# Patient Record
Sex: Female | Born: 1975 | Race: White | Hispanic: No | Marital: Married | Smoking: Never smoker
Health system: Southern US, Community
[De-identification: ages and names within clinical notes are randomized; demographics above are authoritative.]

## PROBLEM LIST (undated history)

## (undated) DIAGNOSIS — R079 Chest pain, unspecified: Secondary | ICD-10-CM

## (undated) DIAGNOSIS — E559 Vitamin D deficiency, unspecified: Secondary | ICD-10-CM

## (undated) DIAGNOSIS — F419 Anxiety disorder, unspecified: Secondary | ICD-10-CM

## (undated) DIAGNOSIS — E669 Obesity, unspecified: Secondary | ICD-10-CM

## (undated) HISTORY — DX: Obesity, unspecified: E66.9

## (undated) HISTORY — DX: Chest pain, unspecified: R07.9

## (undated) HISTORY — DX: Anxiety disorder, unspecified: F41.9

## (undated) HISTORY — DX: Vitamin D deficiency, unspecified: E55.9

---

## 2008-09-29 ENCOUNTER — Emergency Department (HOSPITAL_COMMUNITY): Admission: EM | Admit: 2008-09-29 | Discharge: 2008-09-30 | Payer: Self-pay | Admitting: Emergency Medicine

## 2009-05-10 ENCOUNTER — Emergency Department (HOSPITAL_COMMUNITY): Admission: EM | Admit: 2009-05-10 | Discharge: 2009-05-10 | Payer: Self-pay | Admitting: Emergency Medicine

## 2010-06-16 LAB — DIFFERENTIAL
Basophils Relative: 1 % (ref 0–1)
Lymphs Abs: 2 10*3/uL (ref 0.7–4.0)
Monocytes Relative: 7 % (ref 3–12)
Neutro Abs: 6 10*3/uL (ref 1.7–7.7)
Neutrophils Relative %: 67 % (ref 43–77)

## 2010-06-16 LAB — POCT CARDIAC MARKERS: Myoglobin, poc: 38.3 ng/mL (ref 12–200)

## 2010-06-16 LAB — CBC
RBC: 4.82 MIL/uL (ref 3.87–5.11)
WBC: 8.9 10*3/uL (ref 4.0–10.5)

## 2010-06-16 LAB — BASIC METABOLIC PANEL
Calcium: 9.3 mg/dL (ref 8.4–10.5)
Creatinine, Ser: 1.04 mg/dL (ref 0.4–1.2)
GFR calc Af Amer: >60 mL/min (ref >60)
GFR calc non Af Amer: >60 mL/min (ref >60)

## 2010-06-16 LAB — URINALYSIS, ROUTINE W REFLEX MICROSCOPIC
Glucose, UA: NEGATIVE mg/dL
Hgb urine dipstick: NEGATIVE
Specific Gravity, Urine: 1.015 (ref 1.005–1.030)
Urobilinogen, UA: 0.2 mg/dL (ref 0.0–1.0)

## 2014-12-14 ENCOUNTER — Other Ambulatory Visit (HOSPITAL_COMMUNITY): Payer: Self-pay | Admitting: Family Medicine

## 2014-12-14 DIAGNOSIS — Z1231 Encounter for screening mammogram for malignant neoplasm of breast: Secondary | ICD-10-CM

## 2014-12-25 ENCOUNTER — Ambulatory Visit (HOSPITAL_COMMUNITY): Payer: Self-pay

## 2014-12-29 ENCOUNTER — Ambulatory Visit (HOSPITAL_COMMUNITY): Payer: Self-pay

## 2015-02-26 ENCOUNTER — Ambulatory Visit (INDEPENDENT_AMBULATORY_CARE_PROVIDER_SITE_OTHER): Payer: 59 | Admitting: Podiatry

## 2015-02-26 ENCOUNTER — Ambulatory Visit: Payer: Self-pay

## 2015-02-26 ENCOUNTER — Ambulatory Visit (INDEPENDENT_AMBULATORY_CARE_PROVIDER_SITE_OTHER): Payer: 59

## 2015-02-26 ENCOUNTER — Encounter: Payer: Self-pay | Admitting: Podiatry

## 2015-02-26 VITALS — BP 125/81 | HR 100 | Resp 18

## 2015-02-26 DIAGNOSIS — R52 Pain, unspecified: Secondary | ICD-10-CM | POA: Diagnosis not present

## 2015-02-26 DIAGNOSIS — M722 Plantar fascial fibromatosis: Secondary | ICD-10-CM

## 2015-02-26 MED ORDER — DICLOFENAC SODIUM 75 MG PO TBEC: 75.0000 mg | DELAYED_RELEASE_TABLET | Freq: Two times a day (BID) | ORAL | Status: DC

## 2015-02-26 NOTE — Progress Notes (Signed)
   Subjective:    Patient ID: Stefanie Clark, female    DOB: 1976/01/10, 39 y.o.   MRN: [id number]  HPI  39-year-old female presents the office with complaints of painful bottom of both of her heels which his been ongoing for approximate 2 months that she describes as a throbbing pain to the bottom of the heel into the arch of the foot. She states the pain is worse in the morning and she first gets separate after periods of walking. She denies any recent injury or trauma. No swelling or redness. No tingling or numbness. No recent treatment. The pain does not wake her up at night. No other complaints at this time.  Review of Systems  All other systems reviewed and are negative.      Objective:   Physical Exam General: AAO x3, NAD  Dermatological: Skin is warm, dry and supple bilateral. Nails x 10 are well manicured; remaining integument appears unremarkable at this time. There are no open sores, no preulcerative lesions, no rash or signs of infection present.  Vascular: Dorsalis Pedis artery and Posterior Tibial artery pedal pulses are 2/4 bilateral with immedate capillary fill time. Pedal hair growth present. No varicosities and no lower extremity edema present bilateral. There is no pain with calf compression, swelling, warmth, erythema.   Neruologic: Grossly intact via light touch bilateral. Vibratory intact via tuning fork bilateral. Protective threshold with Semmes Wienstein monofilament intact to all pedal sites bilateral. Patellar and Achilles deep tendon reflexes 2+ bilateral. No Babinski or clonus noted bilateral.   Musculoskeletal: Tenderness to palpation along the plantar medial tubercle of the calcaneus at the insertion of plantar fascia on the left and right foot. There is no pain along the course of the plantar fascia within the arch of the foot. Plantar fascia appears to be intact. There is no pain with lateral compression of the calcaneus or pain with vibratory sensation. There is  no pain along the course or insertion of the achilles tendon. No other areas of tenderness to bilateral lower extremities.  Muscular strength 5/5 in all groups tested bilateral.  Gait: Unassisted, Nonantalgic.      Assessment & Plan:  39 year old female bilateral heel pain, likely plantar fasciitis -X-rays were obtained and reviewed with the patient.  -Treatment options discussed including all alternatives, risks, and complications -Etiology of symptoms were discussed -Discussed steroid injections  -Plantar fascial brace -Prescribed voltaren. Discussed side effects of the medication and directed to stop if any are to occur and call the office.  -Stretching and icing exercises daily. -Discussed shoe gear modifications and orthotics. She did purchase power steps today. Break in instructions were discussed the patient. -Follow-up in 3 weeks or sooner if any problems arise. In the meantime, encouraged to call the office with any questions, concerns, change in symptoms.   Ovid CurdMatthew Wagoner, DPM

## 2015-03-02 DIAGNOSIS — M722 Plantar fascial fibromatosis: Secondary | ICD-10-CM | POA: Insufficient documentation

## 2015-03-02 NOTE — Patient Instructions (Signed)

## 2015-03-13 ENCOUNTER — Encounter (HOSPITAL_COMMUNITY): Payer: Self-pay | Admitting: Emergency Medicine

## 2015-03-13 ENCOUNTER — Emergency Department (HOSPITAL_COMMUNITY)
Admission: EM | Admit: 2015-03-13 | Discharge: 2015-03-14 | Disposition: A | Discharge status: Home / Self Care | Payer: 59 | Attending: Emergency Medicine | Admitting: Emergency Medicine

## 2015-03-13 DIAGNOSIS — R300 Dysuria: Secondary | ICD-10-CM | POA: Diagnosis present

## 2015-03-13 DIAGNOSIS — Z3202 Encounter for pregnancy test, result negative: Secondary | ICD-10-CM | POA: Diagnosis not present

## 2015-03-13 DIAGNOSIS — N390 Urinary tract infection, site not specified: Secondary | ICD-10-CM | POA: Diagnosis not present

## 2015-03-13 NOTE — ED Notes (Signed)
Pt states that she has had dysuria since this morning and now cannot urinate and feels the pressure to urinate. Alert and oriented.

## 2015-03-14 LAB — I-STAT CHEM 8, ED
BUN: 28 mg/dL — ABNORMAL HIGH (ref 6–20)
CHLORIDE: 102 mmol/L (ref 101–111)
Calcium, Ion: 1.14 mmol/L (ref 1.12–1.23)
Creatinine, Ser: 0.8 mg/dL (ref 0.44–1.00)
GLUCOSE: 89 mg/dL (ref 65–99)
HCT: 43 % (ref 36.0–46.0)
HEMOGLOBIN: 14.6 g/dL (ref 12.0–15.0)
POTASSIUM: 3.9 mmol/L (ref 3.5–5.1)
SODIUM: 139 mmol/L (ref 135–145)
TCO2: 26 mmol/L (ref 0–100)

## 2015-03-14 LAB — PREGNANCY, URINE: PREG TEST UR: NEGATIVE

## 2015-03-14 LAB — URINALYSIS, ROUTINE W REFLEX MICROSCOPIC
Bilirubin Urine: NEGATIVE
Glucose, UA: NEGATIVE mg/dL
Ketones, ur: NEGATIVE mg/dL
NITRITE: POSITIVE — AB
PROTEIN: 30 mg/dL — AB
SPECIFIC GRAVITY, URINE: 1.028 (ref 1.005–1.030)
pH: 6.5 (ref 5.0–8.0)

## 2015-03-14 LAB — URINE MICROSCOPIC-ADD ON

## 2015-03-14 MED ORDER — CEPHALEXIN 500 MG PO CAPS
500.0000 mg | ORAL_CAPSULE | Freq: Once | ORAL | Status: AC
  Administered 2015-03-14: 500 mg via ORAL
  Filled 2015-03-14: qty 1

## 2015-03-14 MED ORDER — CEPHALEXIN 500 MG PO CAPS: 500.0000 mg | ORAL_CAPSULE | Freq: Two times a day (BID) | ORAL | Status: DC

## 2015-03-14 MED ORDER — PHENAZOPYRIDINE HCL 200 MG PO TABS: 200.0000 mg | ORAL_TABLET | Freq: Three times a day (TID) | ORAL | Status: DC

## 2015-03-14 NOTE — ED Notes (Signed)
Discharge instructions, follow up care, and rx x2 reviewed with patient. Patient verbalized understanding. 

## 2015-03-14 NOTE — ED Provider Notes (Signed)
CSN: [id number]     Arrival date & time 03/13/15  2207 History   First MD Initiated Contact with Patient 03/14/15 0011     Chief Complaint  Patient presents with  . Dysuria     (Consider location/radiation/quality/duration/timing/severity/associated sxs/prior Treatment) HPI Comments: Patient is a 40 year old female with no sick and past medical history. She presents to the emergency department today for further evaluation of difficulty urinating. She reports that her symptoms began 5 days ago. She reports feeling as though she has had a decreased urinary stream but a sensation of bladder fullness. She reports having some mild left flank pain which is intermittent. She had 2 episodes of vomiting yesterday without any subsequent emesis. No associated fever, dysuria, hematuria, vaginal complaints, or abdominal surgeries. No history of kidney stones, though her father has had a history of kidney stones. No complaints of nausea at present.  PCP - Dr. Jean RosenthalJackson  The history is provided by the patient. No language interpreter was used.    History reviewed. No pertinent past medical history. History reviewed. No pertinent past surgical history. History reviewed. No pertinent family history. Social History  Substance Use Topics  . Smoking status: Never Smoker   . Smokeless tobacco: Never Used  . Alcohol Use: No   OB History    No data available      Review of Systems  Genitourinary: Positive for frequency, decreased urine volume and difficulty urinating. Negative for dysuria.  All other systems reviewed and are negative.   Allergies  Shellfish-derived products and Iodides  Home Medications   Prior to Admission medications   Medication Sig Start Date End Date Taking? Authorizing Provider  diclofenac (VOLTAREN) 75 MG EC tablet Take 1 tablet (75 mg total) by mouth 2 (two) times daily. Patient taking differently: Take 75 mg by mouth 2 (two) times daily as needed for mild pain or moderate  pain.  02/26/15  Yes Vivi BarrackMatthew R Wagoner, DPM  cephALEXin (KEFLEX) 500 MG capsule Take 1 capsule (500 mg total) by mouth 2 (two) times daily. 03/14/15   Antony MaduraKelly Jamesrobert Ohanesian, PA-C  phenazopyridine (PYRIDIUM) 200 MG tablet Take 1 tablet (200 mg total) by mouth 3 (three) times daily. 03/14/15   Antony MaduraKelly Makella Buckingham, PA-C   BP 132/78 mmHg  Pulse 87  Temp(Src) 97.7 F (36.5 C) (Oral)  Resp 18  SpO2 100%  LMP 02/28/2015 (Approximate)   Physical Exam  Constitutional: She is oriented to person, place, and time. She appears well-developed and well-nourished. No distress.  Nontoxic/nonseptic appearing  HENT:  Head: Normocephalic and atraumatic.  Eyes: Conjunctivae and EOM are normal. No scleral icterus.  Neck: Normal range of motion.  Cardiovascular: Normal rate, regular rhythm and intact distal pulses.   Pulmonary/Chest: Effort normal and breath sounds normal. No respiratory distress. She has no wheezes. She has no rales.  Respirations even and unlabored  Abdominal: Soft. She exhibits no distension. There is tenderness (mild, suprapubic). There is no rebound and no guarding.  Soft abdomen. Suprapubic TTP, mild. No guarding or peritoneal signs. No masses. Bladder scan 104cc.  Musculoskeletal: Normal range of motion.  Neurological: She is alert and oriented to person, place, and time. She exhibits normal muscle tone. Coordination normal.  GCS 15. Patient moving all extremities.  Skin: Skin is warm and dry. No rash noted. She is not diaphoretic. No erythema. No pallor.  Psychiatric: She has a normal mood and affect. Her behavior is normal.  Nursing note and vitals reviewed.   ED Course  Procedures (including critical  care time) Labs Review Labs Reviewed  URINALYSIS, ROUTINE W REFLEX MICROSCOPIC (NOT AT [facility]) - Abnormal; Notable for the following:    APPearance CLOUDY (*)    Hgb urine dipstick SMALL (*)    Protein, ur 30 (*)    Nitrite POSITIVE (*)    Leukocytes, UA MODERATE (*)    All other components within  normal limits  URINE MICROSCOPIC-ADD ON - Abnormal; Notable for the following:    Squamous Epithelial / LPF 0-5 (*)    Bacteria, UA FEW (*)    All other components within normal limits  I-STAT CHEM 8, ED - Abnormal; Notable for the following:    BUN 28 (*)    All other components within normal limits  URINE CULTURE  PREGNANCY, URINE    Imaging Review No results found.   I have personally reviewed and evaluated these images and lab results as part of my medical decision-making.   EKG Interpretation None      MDM   Final diagnoses:  UTI (lower urinary tract infection)    Pt has been diagnosed with a UTI. Pt is afebrile, no CVA tenderness, and normotensive. Patient to be discharged home with antibiotics and instructions to follow up with PCP if symptoms persist. Return precautions given at discharge. Patient discharged in good condition with no unaddressed concerns.   Filed Vitals:   03/13/15 2216 03/14/15 0041  BP: 120/76 132/78  Pulse: 92 87  Temp: 97.7 F (36.5 C)   TempSrc: Oral   Resp: 18 18  SpO2: 99% 100%     Antony Madura, PA-C 03/14/15 0135  Rolland Porter, MD 03/17/15 1221

## 2015-03-14 NOTE — Discharge Instructions (Signed)

## 2015-03-16 LAB — URINE CULTURE

## 2015-03-17 ENCOUNTER — Telehealth (HOSPITAL_BASED_OUTPATIENT_CLINIC_OR_DEPARTMENT_OTHER): Payer: Self-pay | Admitting: Emergency Medicine

## 2015-03-17 NOTE — Telephone Encounter (Signed)
Post ED Visit - Positive Culture Follow-up  Culture report reviewed by antimicrobial stewardship pharmacist:  []  Stefanie Clark, Pharm.D. []  Stefanie Clark, [address] BCPS [x]  Stefanie Clark, Pharm.D. []  Stefanie Clark, Pharm.D., BCPS []  Stefanie Clark, [address] BCPS, AAHIVP []  Stefanie Clark, Pharm.D., BCPS, AAHIVP []  Stefanie Clark, Pharm.D. []  Stefanie Clark, [address]  Positive urine culture E. coli Treated with cephalexin, organism sensitive to the same and no further patient follow-up is required at this time.  Stefanie Clark, Stefanie Clark 03/17/2015, 3:07 PM

## 2015-03-26 ENCOUNTER — Ambulatory Visit: Payer: 59 | Admitting: Podiatry

## 2015-04-02 ENCOUNTER — Other Ambulatory Visit (HOSPITAL_COMMUNITY): Payer: Self-pay | Admitting: Family Medicine

## 2015-04-02 DIAGNOSIS — Z1231 Encounter for screening mammogram for malignant neoplasm of breast: Secondary | ICD-10-CM

## 2015-04-09 ENCOUNTER — Ambulatory Visit (HOSPITAL_COMMUNITY): Payer: 59

## 2015-04-17 ENCOUNTER — Ambulatory Visit: Payer: 59 | Admitting: Podiatry

## 2015-05-01 ENCOUNTER — Ambulatory Visit: Payer: 59 | Admitting: Podiatry

## 2015-05-09 ENCOUNTER — Ambulatory Visit (HOSPITAL_COMMUNITY)
Admission: RE | Admit: 2015-05-09 | Discharge: 2015-05-09 | Disposition: A | Discharge status: Home / Self Care | Payer: 59 | Source: Ambulatory Visit | Attending: Family Medicine | Admitting: Family Medicine

## 2015-05-09 DIAGNOSIS — Z1231 Encounter for screening mammogram for malignant neoplasm of breast: Secondary | ICD-10-CM | POA: Diagnosis present

## 2015-05-18 ENCOUNTER — Ambulatory Visit: Payer: 59 | Admitting: Podiatry

## 2015-05-28 ENCOUNTER — Ambulatory Visit: Payer: 59 | Admitting: Podiatry

## 2015-06-11 ENCOUNTER — Telehealth: Payer: Self-pay | Admitting: *Deleted

## 2015-06-11 ENCOUNTER — Encounter: Payer: Self-pay | Admitting: Podiatry

## 2015-06-11 ENCOUNTER — Ambulatory Visit (INDEPENDENT_AMBULATORY_CARE_PROVIDER_SITE_OTHER): Payer: 59 | Admitting: Podiatry

## 2015-06-11 DIAGNOSIS — M722 Plantar fascial fibromatosis: Secondary | ICD-10-CM | POA: Diagnosis not present

## 2015-06-11 MED ORDER — METHYLPREDNISOLONE 4 MG PO TBPK: ORAL_TABLET | ORAL | Status: DC

## 2015-06-11 MED ORDER — NONFORMULARY OR COMPOUNDED ITEM: Status: DC

## 2015-06-11 NOTE — Telephone Encounter (Signed)
Dr. Ardelle AntonWagoner ordered [facility] Antiinflammatory cream.  Faxed.

## 2015-06-11 NOTE — Patient Instructions (Signed)
You can start the medrol dose pack. Once this is complete you can go back to the volaren (anti-inflammatory). Do not take both at the same time.  Will order compound cream for your feet. This will be mailed to you Ice the foot daily Continue with inserts Start night splint

## 2015-06-12 NOTE — Progress Notes (Signed)
Patient ID: Stefanie Clark Poynter, female   DOB: January 01, 1976, [age] y.o.   MRN: [id number]  Subjective: Stefanie Clark Kamm presents to the office today for follow-up evaluation of bilateral heel pain with the left worse than the right. She's been using her husband's anti-inflammatory cream which seems to help. She states that she's been the stretching and icing exercises as well as taking anti-inflammatories. She states the pain is about the same as what was last appointed. She states that she would like to hold off on any steroid injection at this time. The pain does not wake her up at night. Denies any numbness or tingling. No claudication symptoms.  No other complaints at this time. No acute changes since last appointment. They deny any systemic complaints such as fevers, chills, nausea, vomiting.  Objective: General: AAO x3, NAD  Dermatological: Skin is warm, dry and supple bilateral. Nails x 10 are well manicured; remaining integument appears unremarkable at this time. There are no open sores, no preulcerative lesions, no rash or signs of infection present.  Vascular: Dorsalis Pedis artery and Posterior Tibial artery pedal pulses are 2/4 bilateral with immedate capillary fill time. Pedal hair growth present. There is no pain with calf compression, swelling, warmth, erythema.   Neruologic: Grossly intact via light touch bilateral. Vibratory intact via tuning fork bilateral. Protective threshold with Semmes Wienstein monofilament intact to all pedal sites bilateral.   Musculoskeletal: There is continued tenderness palpation along the plantar medial tubercle of the calcaneus at the insertion of the plantar fascia on the left >> worse foot. There is no pain along the course of the plantar fascia within the arch of the foot. Plantar fascia appears to be intact bilaterally. There is no pain with lateral compression of the calcaneus and there is no pain with vibratory sensation. There is no pain along the course or  insertion of the Achilles tendon. There are no other areas of tenderness to bilateral lower extremities. No gross boney pedal deformities bilateral. No pain, crepitus, or limitation noted with foot and ankle range of motion bilateral. Muscular strength 5/5 in all groups tested bilateral. Equinus present.  Gait: Unassisted, Nonantalgic.   Assessment: Presents for follow-up evaluation for heel pain, likely plantar fasciitis   Plan: -Treatment options discussed including all alternatives, risks, and complications -Discussed steroid injection but she wishes to hold off. Prescribed Medrol Dosepak. Once this is complete she can start anti-inflammatory. Also prescribed compound cream. -Dispensed night splint -Continue plantar fascial brace, supportive shoes. -Ice and stretching exercises on a daily basis. -Continue supportive shoe gear. -Follow-up in 4 weeks or sooner if any problems arise. In the meantime, encouraged to call the office with any questions, concerns, change in symptoms.  *I discussed that if symptoms continue likely recommend steroid injection next appointment.  Ovid CurdMatthew Wagoner, DPM

## 2015-07-06 ENCOUNTER — Other Ambulatory Visit (HOSPITAL_COMMUNITY): Payer: Self-pay | Admitting: Chiropractic Medicine

## 2015-07-06 DIAGNOSIS — M9903 Segmental and somatic dysfunction of lumbar region: Secondary | ICD-10-CM

## 2015-07-09 ENCOUNTER — Ambulatory Visit: Payer: 59 | Admitting: Podiatry

## 2015-07-10 ENCOUNTER — Other Ambulatory Visit (HOSPITAL_COMMUNITY): Payer: Self-pay | Admitting: Orthopaedic Surgery

## 2015-07-13 ENCOUNTER — Ambulatory Visit (HOSPITAL_COMMUNITY): Payer: 59

## 2015-07-23 ENCOUNTER — Ambulatory Visit: Payer: 59 | Admitting: Podiatry

## 2015-07-27 ENCOUNTER — Ambulatory Visit (HOSPITAL_COMMUNITY)
Admission: RE | Admit: 2015-07-27 | Discharge: 2015-07-27 | Disposition: A | Discharge status: Home / Self Care | Payer: 59 | Source: Ambulatory Visit | Attending: Chiropractic Medicine | Admitting: Chiropractic Medicine

## 2015-07-27 DIAGNOSIS — M5136 Other intervertebral disc degeneration, lumbar region: Secondary | ICD-10-CM | POA: Insufficient documentation

## 2015-07-27 DIAGNOSIS — M9903 Segmental and somatic dysfunction of lumbar region: Secondary | ICD-10-CM

## 2015-08-08 ENCOUNTER — Ambulatory Visit: Payer: 59 | Admitting: Podiatry

## 2015-09-05 ENCOUNTER — Ambulatory Visit: Payer: 59 | Admitting: Podiatry

## 2015-09-07 ENCOUNTER — Ambulatory Visit: Payer: 59 | Admitting: Podiatry

## 2015-09-15 ENCOUNTER — Emergency Department (HOSPITAL_COMMUNITY)
Admission: EM | Admit: 2015-09-15 | Discharge: 2015-09-15 | Disposition: A | Discharge status: Home / Self Care | Payer: 59 | Attending: Emergency Medicine | Admitting: Emergency Medicine

## 2015-09-15 ENCOUNTER — Emergency Department (HOSPITAL_COMMUNITY): Payer: 59

## 2015-09-15 ENCOUNTER — Encounter (HOSPITAL_COMMUNITY): Payer: Self-pay | Admitting: *Deleted

## 2015-09-15 DIAGNOSIS — R55 Syncope and collapse: Secondary | ICD-10-CM | POA: Insufficient documentation

## 2015-09-15 DIAGNOSIS — M25551 Pain in right hip: Secondary | ICD-10-CM | POA: Diagnosis not present

## 2015-09-15 DIAGNOSIS — Y939 Activity, unspecified: Secondary | ICD-10-CM | POA: Diagnosis not present

## 2015-09-15 DIAGNOSIS — S51811A Laceration without foreign body of right forearm, initial encounter: Secondary | ICD-10-CM | POA: Insufficient documentation

## 2015-09-15 DIAGNOSIS — Y999 Unspecified external cause status: Secondary | ICD-10-CM | POA: Insufficient documentation

## 2015-09-15 DIAGNOSIS — S0081XA Abrasion of other part of head, initial encounter: Secondary | ICD-10-CM | POA: Diagnosis not present

## 2015-09-15 DIAGNOSIS — Y92410 Unspecified street and highway as the place of occurrence of the external cause: Secondary | ICD-10-CM | POA: Insufficient documentation

## 2015-09-15 LAB — POC URINE PREG, ED: Preg Test, Ur: NEGATIVE
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
## 2015-09-21 ENCOUNTER — Ambulatory Visit: Payer: 59 | Admitting: Podiatry

## 2015-09-24 ENCOUNTER — Ambulatory Visit (INDEPENDENT_AMBULATORY_CARE_PROVIDER_SITE_OTHER): Payer: 59 | Admitting: Podiatry

## 2015-09-24 ENCOUNTER — Encounter: Payer: Self-pay | Admitting: Podiatry

## 2015-09-24 VITALS — BP 112/67 | HR 81 | Resp 16

## 2015-09-24 DIAGNOSIS — M722 Plantar fascial fibromatosis: Secondary | ICD-10-CM | POA: Diagnosis not present

## 2015-09-24 MED ORDER — TRIAMCINOLONE ACETONIDE 10 MG/ML IJ SUSP
10.0000 mg | Freq: Once | INTRAMUSCULAR | Status: AC
  Administered 2015-09-24: 10 mg

## 2015-09-24 MED ORDER — DICLOFENAC SODIUM 75 MG PO TBEC: 75.0000 mg | DELAYED_RELEASE_TABLET | Freq: Two times a day (BID) | ORAL | Status: DC

## 2015-09-25 NOTE — Progress Notes (Signed)
Subjective:     Patient ID: Stefanie Clark, female   DOB: 16-Aug-1975, 40 y.o.   MRN: [id number]  HPI patient states my heel is still bothering me quite a bit and I know that I will need to do something to reduce the pressure against my feet   Review of Systems     Objective:   Physical Exam Neurovascular status intact muscle strength adequate with inflammatory changes around the plantar aspect of the left heel with fluid buildup and also noted to have moderate depression of the arch bilateral    Assessment:     Plantar fasciitis left with inflammation fluid buildup and also noted to have moderate depression of the arch bilateral    Plan:     H&P condition reviewed and injected the plantar fascial left 3 mg Kenalog 5 mg Xylocaine and applied and discussed orthotics and scanned for custom orthotic devices

## 2015-10-15 ENCOUNTER — Ambulatory Visit: Payer: 59 | Admitting: Podiatry

## 2015-10-19 ENCOUNTER — Ambulatory Visit (INDEPENDENT_AMBULATORY_CARE_PROVIDER_SITE_OTHER): Payer: 59 | Admitting: Podiatry

## 2015-10-19 ENCOUNTER — Encounter: Payer: Self-pay | Admitting: Podiatry

## 2015-10-19 DIAGNOSIS — M722 Plantar fascial fibromatosis: Secondary | ICD-10-CM

## 2015-10-19 NOTE — Patient Instructions (Signed)

## 2015-10-22 NOTE — Progress Notes (Signed)
Subjective:     Patient ID: Stefanie Clark CompanionJody Silberman, female   DOB: 05/01/1975, 40 y.o.   MRN: [id number]  HPI patient states she's doing well   Review of Systems     Objective:   Physical Exam Neurovascular status intact patient has good range of motion with significant diminishment of plantar heel pain with fluid buildup noted around the medial band but minimal discomfort    Assessment:     Improved fasciitis symptomatology    Plan:     H&P condition reviewed and recommended orthotics which were dispensed with all instructions on usage along with not going barefoot and supportive shoe gear usage. Reappoint 4 weeks

## 2016-01-25 ENCOUNTER — Ambulatory Visit: Payer: 59 | Admitting: Podiatry

## 2016-01-30 ENCOUNTER — Ambulatory Visit: Payer: 59 | Admitting: Podiatry

## 2016-02-22 ENCOUNTER — Encounter: Payer: 59 | Admitting: Podiatry

## 2016-03-07 ENCOUNTER — Encounter: Payer: 59 | Admitting: Podiatry

## 2016-03-07 NOTE — Progress Notes (Signed)
This encounter was created in error - please disregard.

## 2016-03-24 NOTE — Progress Notes (Signed)
This encounter was created in error - please disregard.

## 2016-04-07 ENCOUNTER — Other Ambulatory Visit (HOSPITAL_COMMUNITY): Payer: Self-pay | Admitting: Family Medicine

## 2016-04-07 DIAGNOSIS — Z1231 Encounter for screening mammogram for malignant neoplasm of breast: Secondary | ICD-10-CM

## 2016-05-08 ENCOUNTER — Ambulatory Visit (HOSPITAL_COMMUNITY): Payer: 59

## 2016-05-16 ENCOUNTER — Ambulatory Visit (HOSPITAL_COMMUNITY): Payer: 59

## 2016-05-30 ENCOUNTER — Ambulatory Visit (HOSPITAL_COMMUNITY)
Admission: RE | Admit: 2016-05-30 | Discharge: 2016-05-30 | Disposition: A | Discharge status: Home / Self Care | Payer: 59 | Source: Ambulatory Visit | Attending: Family Medicine | Admitting: Family Medicine

## 2016-05-30 DIAGNOSIS — Z1231 Encounter for screening mammogram for malignant neoplasm of breast: Secondary | ICD-10-CM | POA: Insufficient documentation

## 2016-05-30 DIAGNOSIS — N946 Dysmenorrhea, unspecified: Secondary | ICD-10-CM | POA: Diagnosis not present

## 2016-05-30 DIAGNOSIS — S40851S Superficial foreign body of right upper arm, sequela: Secondary | ICD-10-CM | POA: Diagnosis not present

## 2016-09-09 ENCOUNTER — Other Ambulatory Visit (HOSPITAL_COMMUNITY)
Admission: RE | Admit: 2016-09-09 | Discharge: 2016-09-09 | Disposition: A | Discharge status: Home / Self Care | Payer: 59 | Source: Other Acute Inpatient Hospital | Attending: Urology | Admitting: Urology

## 2016-09-09 ENCOUNTER — Ambulatory Visit (INDEPENDENT_AMBULATORY_CARE_PROVIDER_SITE_OTHER): Payer: 59 | Admitting: Urology

## 2016-09-09 DIAGNOSIS — R351 Nocturia: Secondary | ICD-10-CM | POA: Diagnosis not present

## 2016-09-09 DIAGNOSIS — R8271 Bacteriuria: Secondary | ICD-10-CM | POA: Diagnosis not present

## 2016-09-09 DIAGNOSIS — R350 Frequency of micturition: Secondary | ICD-10-CM

## 2016-09-09 DIAGNOSIS — R3915 Urgency of urination: Secondary | ICD-10-CM | POA: Diagnosis not present

## 2016-09-09 DIAGNOSIS — R102 Pelvic and perineal pain: Secondary | ICD-10-CM | POA: Diagnosis not present

## 2016-09-12 LAB — URINE CULTURE: Culture: 80000 — AB

## 2016-09-16 ENCOUNTER — Other Ambulatory Visit: Payer: Self-pay | Admitting: Urology

## 2016-09-16 DIAGNOSIS — R102 Pelvic and perineal pain: Secondary | ICD-10-CM

## 2016-09-19 ENCOUNTER — Encounter (HOSPITAL_COMMUNITY): Payer: Self-pay

## 2016-09-19 ENCOUNTER — Ambulatory Visit (HOSPITAL_COMMUNITY)
Admission: RE | Admit: 2016-09-19 | Discharge: 2016-09-19 | Disposition: A | Discharge status: Home / Self Care | Payer: 59 | Source: Ambulatory Visit | Attending: Urology | Admitting: Urology

## 2016-09-24 ENCOUNTER — Ambulatory Visit (HOSPITAL_COMMUNITY): Admission: RE | Admit: 2016-09-24 | Payer: 59 | Source: Ambulatory Visit

## 2016-11-11 ENCOUNTER — Ambulatory Visit: Payer: 59 | Admitting: Urology

## 2016-12-29 DIAGNOSIS — H52223 Regular astigmatism, bilateral: Secondary | ICD-10-CM | POA: Diagnosis not present

## 2016-12-29 DIAGNOSIS — H524 Presbyopia: Secondary | ICD-10-CM | POA: Diagnosis not present

## 2016-12-29 DIAGNOSIS — H5203 Hypermetropia, bilateral: Secondary | ICD-10-CM | POA: Diagnosis not present

## 2017-02-12 DIAGNOSIS — M79672 Pain in left foot: Secondary | ICD-10-CM | POA: Diagnosis not present

## 2017-03-20 DIAGNOSIS — M7672 Peroneal tendinitis, left leg: Secondary | ICD-10-CM | POA: Diagnosis not present

## 2017-03-20 DIAGNOSIS — M79672 Pain in left foot: Secondary | ICD-10-CM | POA: Diagnosis not present

## 2017-04-03 DIAGNOSIS — M67962 Unspecified disorder of synovium and tendon, left lower leg: Secondary | ICD-10-CM | POA: Diagnosis not present

## 2017-04-22 DIAGNOSIS — M67969 Unspecified disorder of synovium and tendon, unspecified lower leg: Secondary | ICD-10-CM | POA: Diagnosis not present

## 2017-07-30 ENCOUNTER — Other Ambulatory Visit (HOSPITAL_COMMUNITY): Payer: Self-pay | Admitting: Family Medicine

## 2017-07-30 DIAGNOSIS — Z1231 Encounter for screening mammogram for malignant neoplasm of breast: Secondary | ICD-10-CM

## 2017-08-10 ENCOUNTER — Ambulatory Visit (HOSPITAL_COMMUNITY): Payer: 59

## 2017-10-02 ENCOUNTER — Inpatient Hospital Stay (HOSPITAL_COMMUNITY): Admission: RE | Admit: 2017-10-02 | Payer: 59 | Source: Ambulatory Visit

## 2017-11-30 ENCOUNTER — Ambulatory Visit (HOSPITAL_COMMUNITY): Payer: 59

## 2017-12-10 ENCOUNTER — Ambulatory Visit (HOSPITAL_COMMUNITY): Payer: 59

## 2017-12-14 ENCOUNTER — Ambulatory Visit: Payer: 59 | Admitting: Podiatry

## 2018-01-05 ENCOUNTER — Ambulatory Visit: Payer: 59 | Admitting: Urology

## 2018-01-08 ENCOUNTER — Ambulatory Visit (HOSPITAL_COMMUNITY): Payer: 59

## 2018-01-11 ENCOUNTER — Inpatient Hospital Stay (HOSPITAL_COMMUNITY): Admission: RE | Admit: 2018-01-11 | Payer: 59 | Source: Ambulatory Visit

## 2018-02-08 ENCOUNTER — Ambulatory Visit (HOSPITAL_COMMUNITY): Payer: 59

## 2018-02-10 ENCOUNTER — Ambulatory Visit (HOSPITAL_COMMUNITY): Payer: 59

## 2018-03-17 ENCOUNTER — Ambulatory Visit (HOSPITAL_COMMUNITY): Payer: 59
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
## 2018-10-27 ENCOUNTER — Other Ambulatory Visit (HOSPITAL_COMMUNITY): Payer: Self-pay | Admitting: Family Medicine

## 2018-10-27 DIAGNOSIS — Z1231 Encounter for screening mammogram for malignant neoplasm of breast: Secondary | ICD-10-CM

## 2019-09-08 DIAGNOSIS — R7309 Other abnormal glucose: Secondary | ICD-10-CM | POA: Diagnosis not present

## 2019-09-08 DIAGNOSIS — N924 Excessive bleeding in the premenopausal period: Secondary | ICD-10-CM | POA: Diagnosis not present

## 2019-09-08 DIAGNOSIS — R5383 Other fatigue: Secondary | ICD-10-CM | POA: Diagnosis not present

## 2019-09-08 DIAGNOSIS — N920 Excessive and frequent menstruation with regular cycle: Secondary | ICD-10-CM | POA: Diagnosis not present

## 2019-09-08 DIAGNOSIS — E6609 Other obesity due to excess calories: Secondary | ICD-10-CM | POA: Diagnosis not present

## 2019-09-08 DIAGNOSIS — Z6832 Body mass index (BMI) 32.0-32.9, adult: Secondary | ICD-10-CM | POA: Diagnosis not present

## 2019-09-14 DIAGNOSIS — N921 Excessive and frequent menstruation with irregular cycle: Secondary | ICD-10-CM | POA: Diagnosis not present

## 2019-09-26 ENCOUNTER — Encounter: Payer: 59 | Admitting: Adult Health

## 2019-11-30 DIAGNOSIS — N921 Excessive and frequent menstruation with irregular cycle: Secondary | ICD-10-CM | POA: Diagnosis not present

## 2020-06-08 ENCOUNTER — Other Ambulatory Visit (HOSPITAL_COMMUNITY): Payer: Self-pay | Admitting: Family Medicine

## 2020-06-08 DIAGNOSIS — Z1231 Encounter for screening mammogram for malignant neoplasm of breast: Secondary | ICD-10-CM

## 2020-06-27 ENCOUNTER — Inpatient Hospital Stay (HOSPITAL_COMMUNITY): Admission: RE | Admit: 2020-06-27 | Payer: Self-pay | Source: Ambulatory Visit

## 2021-02-07 ENCOUNTER — Other Ambulatory Visit (HOSPITAL_COMMUNITY): Payer: Self-pay | Admitting: Family Medicine

## 2021-02-07 DIAGNOSIS — M25552 Pain in left hip: Secondary | ICD-10-CM

## 2021-03-12 ENCOUNTER — Ambulatory Visit (HOSPITAL_COMMUNITY)
Admission: RE | Admit: 2021-03-12 | Discharge: 2021-03-12 | Disposition: A | Discharge status: Home / Self Care | Payer: 59 | Source: Ambulatory Visit | Attending: Family Medicine | Admitting: Family Medicine

## 2021-03-12 ENCOUNTER — Other Ambulatory Visit: Payer: Self-pay

## 2021-03-12 DIAGNOSIS — M25552 Pain in left hip: Secondary | ICD-10-CM | POA: Insufficient documentation

## 2021-05-10 ENCOUNTER — Ambulatory Visit (HOSPITAL_COMMUNITY): Payer: Self-pay

## 2021-05-23 ENCOUNTER — Ambulatory Visit (HOSPITAL_COMMUNITY): Payer: Self-pay

## 2021-05-30 ENCOUNTER — Ambulatory Visit (HOSPITAL_COMMUNITY): Payer: Self-pay

## 2021-06-03 ENCOUNTER — Ambulatory Visit (HOSPITAL_COMMUNITY): Payer: 59

## 2021-06-05 ENCOUNTER — Ambulatory Visit (HOSPITAL_COMMUNITY)
Admission: RE | Admit: 2021-06-05 | Discharge: 2021-06-05 | Disposition: A | Discharge status: Home / Self Care | Payer: 59 | Source: Ambulatory Visit | Attending: Family Medicine | Admitting: Family Medicine

## 2021-06-05 DIAGNOSIS — Z1231 Encounter for screening mammogram for malignant neoplasm of breast: Secondary | ICD-10-CM

## 2021-06-06 ENCOUNTER — Ambulatory Visit (HOSPITAL_COMMUNITY): Payer: Self-pay

## 2021-08-23 DIAGNOSIS — E6609 Other obesity due to excess calories: Secondary | ICD-10-CM | POA: Diagnosis not present

## 2021-08-23 DIAGNOSIS — M25552 Pain in left hip: Secondary | ICD-10-CM | POA: Diagnosis not present

## 2021-08-23 DIAGNOSIS — R69 Illness, unspecified: Secondary | ICD-10-CM | POA: Diagnosis not present

## 2021-11-08 DIAGNOSIS — M7061 Trochanteric bursitis, right hip: Secondary | ICD-10-CM | POA: Diagnosis not present

## 2021-11-08 DIAGNOSIS — M25551 Pain in right hip: Secondary | ICD-10-CM | POA: Diagnosis not present

## 2021-11-08 DIAGNOSIS — M25552 Pain in left hip: Secondary | ICD-10-CM | POA: Diagnosis not present

## 2021-11-08 DIAGNOSIS — M7062 Trochanteric bursitis, left hip: Secondary | ICD-10-CM | POA: Diagnosis not present

## 2022-06-09 ENCOUNTER — Other Ambulatory Visit (HOSPITAL_COMMUNITY): Payer: Self-pay | Admitting: Family Medicine

## 2022-06-09 DIAGNOSIS — Z1231 Encounter for screening mammogram for malignant neoplasm of breast: Secondary | ICD-10-CM

## 2022-06-16 ENCOUNTER — Ambulatory Visit (HOSPITAL_COMMUNITY): Payer: Medicaid Other

## 2022-07-14 ENCOUNTER — Encounter (HOSPITAL_COMMUNITY): Payer: Self-pay

## 2022-07-14 ENCOUNTER — Inpatient Hospital Stay (HOSPITAL_COMMUNITY): Admission: RE | Admit: 2022-07-14 | Payer: Medicaid Other | Source: Ambulatory Visit

## 2022-08-08 ENCOUNTER — Ambulatory Visit (HOSPITAL_COMMUNITY): Payer: Medicaid Other

## 2022-08-11 ENCOUNTER — Ambulatory Visit (HOSPITAL_COMMUNITY): Payer: Medicaid Other

## 2022-09-15 ENCOUNTER — Ambulatory Visit (HOSPITAL_COMMUNITY): Payer: Medicaid Other

## 2022-10-16 ENCOUNTER — Ambulatory Visit (HOSPITAL_COMMUNITY): Payer: Medicaid Other

## 2022-11-19 ENCOUNTER — Ambulatory Visit (HOSPITAL_COMMUNITY): Payer: Medicaid Other

## 2023-02-23 ENCOUNTER — Encounter (HOSPITAL_COMMUNITY): Payer: Medicaid Other

## 2023-02-23 ENCOUNTER — Other Ambulatory Visit (HOSPITAL_COMMUNITY): Payer: Self-pay | Admitting: Family Medicine

## 2023-02-23 DIAGNOSIS — Z1231 Encounter for screening mammogram for malignant neoplasm of breast: Secondary | ICD-10-CM

## 2023-02-27 ENCOUNTER — Ambulatory Visit (HOSPITAL_COMMUNITY): Payer: Medicaid Other

## 2023-02-27 ENCOUNTER — Encounter (HOSPITAL_BASED_OUTPATIENT_CLINIC_OR_DEPARTMENT_OTHER): Payer: Self-pay

## 2023-02-27 ENCOUNTER — Encounter (HOSPITAL_BASED_OUTPATIENT_CLINIC_OR_DEPARTMENT_OTHER): Payer: Medicaid Other | Admitting: Radiology

## 2023-03-06 ENCOUNTER — Other Ambulatory Visit (HOSPITAL_COMMUNITY): Payer: Self-pay | Admitting: Family Medicine

## 2023-03-06 DIAGNOSIS — Z1231 Encounter for screening mammogram for malignant neoplasm of breast: Secondary | ICD-10-CM

## 2023-03-09 ENCOUNTER — Encounter (HOSPITAL_COMMUNITY): Payer: Self-pay | Admitting: Family Medicine

## 2023-03-09 ENCOUNTER — Encounter (HOSPITAL_COMMUNITY): Payer: Self-pay

## 2023-03-09 ENCOUNTER — Inpatient Hospital Stay (HOSPITAL_COMMUNITY): Admission: RE | Admit: 2023-03-09 | Payer: Medicaid Other | Source: Ambulatory Visit

## 2023-03-09 DIAGNOSIS — Z1231 Encounter for screening mammogram for malignant neoplasm of breast: Secondary | ICD-10-CM

## 2023-03-13 ENCOUNTER — Ambulatory Visit (HOSPITAL_COMMUNITY): Payer: Medicaid Other

## 2023-03-23 ENCOUNTER — Ambulatory Visit (HOSPITAL_COMMUNITY): Payer: Medicaid Other

## 2023-03-31 ENCOUNTER — Other Ambulatory Visit (HOSPITAL_COMMUNITY): Payer: Self-pay | Admitting: Family Medicine

## 2023-03-31 DIAGNOSIS — Z1231 Encounter for screening mammogram for malignant neoplasm of breast: Secondary | ICD-10-CM

## 2023-04-08 ENCOUNTER — Encounter (HOSPITAL_COMMUNITY): Payer: Self-pay

## 2023-04-08 ENCOUNTER — Inpatient Hospital Stay (HOSPITAL_COMMUNITY): Admission: RE | Admit: 2023-04-08 | Payer: Medicaid Other | Source: Ambulatory Visit

## 2023-04-08 ENCOUNTER — Other Ambulatory Visit (HOSPITAL_COMMUNITY): Payer: Self-pay | Admitting: Family Medicine

## 2023-04-08 DIAGNOSIS — Z1231 Encounter for screening mammogram for malignant neoplasm of breast: Secondary | ICD-10-CM

## 2023-04-13 ENCOUNTER — Other Ambulatory Visit (HOSPITAL_COMMUNITY): Payer: Self-pay | Admitting: Family Medicine

## 2023-04-13 ENCOUNTER — Inpatient Hospital Stay (HOSPITAL_COMMUNITY): Admission: RE | Admit: 2023-04-13 | Payer: Medicaid Other | Source: Ambulatory Visit

## 2023-04-13 DIAGNOSIS — Z1231 Encounter for screening mammogram for malignant neoplasm of breast: Secondary | ICD-10-CM

## 2023-04-16 ENCOUNTER — Encounter (HOSPITAL_COMMUNITY): Payer: Medicaid Other

## 2023-04-16 ENCOUNTER — Inpatient Hospital Stay (HOSPITAL_COMMUNITY): Admission: RE | Admit: 2023-04-16 | Payer: Medicaid Other | Source: Ambulatory Visit

## 2023-04-16 DIAGNOSIS — Z1231 Encounter for screening mammogram for malignant neoplasm of breast: Secondary | ICD-10-CM

## 2023-04-17 ENCOUNTER — Other Ambulatory Visit (HOSPITAL_COMMUNITY): Payer: Self-pay | Admitting: Family Medicine

## 2023-04-17 DIAGNOSIS — Z1231 Encounter for screening mammogram for malignant neoplasm of breast: Secondary | ICD-10-CM

## 2023-04-20 ENCOUNTER — Ambulatory Visit (HOSPITAL_COMMUNITY)
Admission: RE | Admit: 2023-04-20 | Discharge: 2023-04-20 | Disposition: A | Discharge status: Home / Self Care | Payer: Self-pay | Source: Ambulatory Visit

## 2023-04-20 DIAGNOSIS — R928 Other abnormal and inconclusive findings on diagnostic imaging of breast: Secondary | ICD-10-CM | POA: Insufficient documentation

## 2023-04-20 DIAGNOSIS — Z1231 Encounter for screening mammogram for malignant neoplasm of breast: Secondary | ICD-10-CM | POA: Insufficient documentation

## 2023-04-23 ENCOUNTER — Other Ambulatory Visit (HOSPITAL_COMMUNITY): Payer: Self-pay | Admitting: Family Medicine

## 2023-04-23 DIAGNOSIS — R928 Other abnormal and inconclusive findings on diagnostic imaging of breast: Secondary | ICD-10-CM

## 2023-04-30 ENCOUNTER — Ambulatory Visit (HOSPITAL_COMMUNITY): Payer: Medicaid Other

## 2023-04-30 ENCOUNTER — Encounter (HOSPITAL_COMMUNITY): Payer: Medicaid Other

## 2023-05-19 ENCOUNTER — Ambulatory Visit (HOSPITAL_COMMUNITY)
Admission: RE | Admit: 2023-05-19 | Discharge: 2023-05-19 | Disposition: A | Discharge status: Home / Self Care | Source: Ambulatory Visit | Attending: Family Medicine | Admitting: Family Medicine

## 2023-05-19 ENCOUNTER — Encounter (HOSPITAL_COMMUNITY): Payer: Self-pay

## 2023-05-19 DIAGNOSIS — R928 Other abnormal and inconclusive findings on diagnostic imaging of breast: Secondary | ICD-10-CM | POA: Diagnosis present

## 2023-05-20 ENCOUNTER — Other Ambulatory Visit (HOSPITAL_COMMUNITY): Payer: Self-pay | Admitting: Family Medicine

## 2023-05-20 DIAGNOSIS — R928 Other abnormal and inconclusive findings on diagnostic imaging of breast: Secondary | ICD-10-CM

## 2023-05-22 ENCOUNTER — Other Ambulatory Visit (HOSPITAL_COMMUNITY): Payer: Self-pay | Admitting: Family Medicine

## 2023-05-22 DIAGNOSIS — N6489 Other specified disorders of breast: Secondary | ICD-10-CM

## 2023-05-28 ENCOUNTER — Ambulatory Visit
Admission: RE | Admit: 2023-05-28 | Discharge: 2023-05-28 | Disposition: A | Discharge status: Home / Self Care | Source: Ambulatory Visit | Attending: Family Medicine | Admitting: Family Medicine

## 2023-05-28 DIAGNOSIS — R928 Other abnormal and inconclusive findings on diagnostic imaging of breast: Secondary | ICD-10-CM

## 2023-05-28 DIAGNOSIS — N6489 Other specified disorders of breast: Secondary | ICD-10-CM

## 2023-05-28 HISTORY — PX: BREAST BIOPSY: SHX20

## 2023-05-29 LAB — SURGICAL PATHOLOGY

## 2023-08-11 ENCOUNTER — Other Ambulatory Visit: Payer: Self-pay | Admitting: Family Medicine

## 2023-08-11 DIAGNOSIS — R921 Mammographic calcification found on diagnostic imaging of breast: Secondary | ICD-10-CM

## 2023-08-11 DIAGNOSIS — N6489 Other specified disorders of breast: Secondary | ICD-10-CM

## 2023-08-17 ENCOUNTER — Other Ambulatory Visit: Payer: Self-pay | Admitting: Student

## 2023-08-17 DIAGNOSIS — M25551 Pain in right hip: Secondary | ICD-10-CM

## 2023-08-17 DIAGNOSIS — M25552 Pain in left hip: Secondary | ICD-10-CM

## 2023-08-19 ENCOUNTER — Ambulatory Visit
Admission: RE | Admit: 2023-08-19 | Discharge: 2023-08-19 | Discharge status: Home / Self Care | Source: Ambulatory Visit | Attending: Student

## 2023-08-19 DIAGNOSIS — M25552 Pain in left hip: Secondary | ICD-10-CM

## 2023-08-19 DIAGNOSIS — M25551 Pain in right hip: Secondary | ICD-10-CM

## 2023-08-24 ENCOUNTER — Other Ambulatory Visit

## 2023-10-05 NOTE — Progress Notes (Signed)
 "      OFFICE NOTE:    Date:  10/06/2023  ID:  Stefanie Clark, DOB March 17, 1975, MRN [id number] PCP: Stefanie Clark Stefanie Clark   HeartCare Providers Cardiologist:  None        Anxiety  Obesity Vit D deficiency Lower extremity edema        Discussed the use of AI scribe software for clinical note transcription with the patient, who gave verbal consent to proceed. History of Present Illness Stefanie Clark is a 48 y.o. female who is referred by Stefanie Stefanie [name], PA* for evaluation of chest pain, murmur.   She has experienced chest pain for several years. The pain originates in the chest and radiates to the back, often triggered by physical exertion or stress. It can occur at night, waking her from sleep, and is associated with shortness of breath, nausea, and sweating. The pain typically lasts 30 to 40 minutes and is described as a pressure that makes it difficult to breathe. She experiences shortness of breath both with activity and at rest, which has been ongoing for a few years. She also reports swelling in her feet, which improves with elevation.  She has a history of a heart murmur and has concerned she may have a leaky valve.  She has a family history of heart disease, with both her maternal and paternal grandmothers having had heart attacks.   She does not smoke, rarely consumes alcohol, and denies drug use. 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  She feels very tired and fatigued. She has experienced indigestion and heartburn, which often coincides with her chest pain. No vomiting, diarrhea, or bloody stools.   ROS-See HPI    Studies Reviewed:  EKG Interpretation Date/Time:  Tuesday October 06 2023 10:48:21 EDT Ventricular Rate:  88 PR Interval:  116 QRS Duration:  82 QT Interval:  344 QTC Calculation: 416 R Axis:   71  Text Interpretation: Normal sinus rhythm with sinus arrhythmia Non-specific ST-t changes Confirmed by  Stefanie Clark [phone]) on 10/06/2023 11:01:42 AM    Labs from PCP-personally reviewed by me today 10/06/2023 09/08/23: Hg 16.6, SCr 0.85, K 4.3, ALT 33, TC 187, Trig 172, HDL 30, LDL 126 10/05/23: TSH 1.69         Physical Exam:  VS:  BP 127/80   Pulse 88   Ht 5' 6 (1.676 m)   Wt 210 lb 3.2 oz (95.3 kg)   SpO2 98%   BMI 33.93 kg/m        Wt Readings from Last 3 Encounters:  10/06/23 210 lb 3.2 oz (95.3 kg)  09/15/15 145 lb (65.8 kg)    Constitutional:      Appearance: Healthy appearance. Not in distress.  Neck:     Vascular: No carotid bruit. JVD normal.  Pulmonary:     Breath sounds: Normal breath sounds. No wheezing. No rales.  Cardiovascular:     Normal rate. Regular rhythm.     Murmurs: There is no murmur.  Edema:    Peripheral edema absent.  Abdominal:     Palpations: Abdomen is soft.       Assessment and Plan:    Assessment & Plan Precordial chest pain Shortness of breath Intermittent chest pain for several years, exacerbated by exertion and stress, sometimes occurring at rest. Pain described as pressure in the chest radiating to the back, associated with shortness of breath, nausea, and sweating. Her symptoms sound  cardiac. She does not have a lot of CV risk factors. Differential includes obstructive coronary artery disease, vasospasm, and microvascular dysfunction, acid reflux, anxiety. Her EKG does not demonstrate any acute changes. She notes a hx of a heart murmur, but I do not hear one on exam today.  - Obtain CCTA to rule out ischemic heart disease  - Order echocardiogram to rule out structural heart disease  - Prescribe nitroglycerin  for chest pain.   - Omeprazole  20 mg once daily x 2 weeks to cover for acid reflux - Follow up 3 mos  Gastroesophageal reflux disease without esophagitis Reports indigestion and heartburn, sometimes coinciding with chest pain. Potential gastrointestinal cause for chest symptoms. Discussed trial of proton pump inhibitor to assess  impact on symptoms. - Recommend over-the-counter Prilosec (omeprazole ) 20 mg daily for two weeks Leg edema Chronic swelling in feet, worsens with prolonged standing and improves with elevation. Likely due to venous insufficiency. No signs of heart failure on examination. - Recommend use of compression hose for symptom management - Advise on leg elevation to reduce swelling         Dispo:  Return in about 3 months (around 01/06/2024) for Follow up after testing, w/ Stefanie Ferrier, PA-C.  Signed, Stefanie Ferrier, PA-C   "

## 2023-10-06 ENCOUNTER — Ambulatory Visit: Attending: Internal Medicine | Admitting: Physician Assistant

## 2023-10-06 ENCOUNTER — Encounter: Payer: Self-pay | Admitting: Physician Assistant

## 2023-10-06 VITALS — BP 127/80 | HR 88 | Ht 66.0 in | Wt 210.2 lb

## 2023-10-06 DIAGNOSIS — K219 Gastro-esophageal reflux disease without esophagitis: Secondary | ICD-10-CM

## 2023-10-06 DIAGNOSIS — R0602 Shortness of breath: Secondary | ICD-10-CM | POA: Diagnosis not present

## 2023-10-06 DIAGNOSIS — R600 Localized edema: Secondary | ICD-10-CM

## 2023-10-06 DIAGNOSIS — R072 Precordial pain: Secondary | ICD-10-CM | POA: Diagnosis not present

## 2023-10-06 MED ORDER — NITROGLYCERIN 0.4 MG SL SUBL: 0.4000 mg | SUBLINGUAL_TABLET | SUBLINGUAL | 1 refills | Status: DC | PRN

## 2023-10-06 MED ORDER — OMEPRAZOLE 20 MG PO CPDR: 20.0000 mg | DELAYED_RELEASE_CAPSULE | Freq: Every day | ORAL | Status: AC

## 2023-10-06 MED ORDER — METOPROLOL TARTRATE 100 MG PO TABS: ORAL_TABLET | ORAL | 0 refills | Status: AC

## 2023-10-06 NOTE — Patient Instructions (Addendum)
 Medication Instructions:  Your physician has recommended you make the following change in your medication:   START Nitroglycerin  0.4 s/l tablet ONLY AS NEEDED FOR CHEST PAIN.    The proper use and anticipated side effects of nitroglycerine has been carefully explained.  If a single episode of chest pain is not relieved by one tablet, the patient will try another within 5 minutes; and if this doesn't relieve the pain, the patient is instructed to call 911 for transportation to an emergency department.   *If you need a refill on your cardiac medications before your next appointment, please call your pharmacy*  Lab Work: TODAY:  BMET  If you have labs (blood work) drawn today and your tests are completely normal, you will receive your results only by: MyChart Message (if you have MyChart) OR A paper copy in the mail If you have any lab test that is abnormal or we need to change your treatment, we will call you to review the results.  Testing/Procedures: Your physician has requested that you have an echocardiogram. Echocardiography is a painless test that uses sound waves to create images of your heart. It provides your doctor with information about the size and shape of your heart and how well your heart's chambers and valves are working. This procedure takes approximately one hour. There are no restrictions for this procedure. Please do NOT wear cologne, perfume, aftershave, or lotions (deodorant is allowed). Please arrive 15 minutes prior to your appointment time.  Please note: We ask at that you not bring children with you during ultrasound (echo/ vascular) testing. Due to room size and safety concerns, children are not allowed in the ultrasound rooms during exams. Our front office staff cannot provide observation of children in our lobby area while testing is being conducted. An adult accompanying a patient to their appointment will only be allowed in the ultrasound room at the discretion of  the ultrasound technician under special circumstances. We apologize for any inconvenience.    Your physician has requested that you have cardiac CT. Cardiac computed tomography (CT) is a painless test that uses an x-ray machine to take clear, detailed pictures of your heart. For further information please visit [email or web address] Please follow instruction sheet  BELOW:    Your cardiac CT will be scheduled at one of the below locations:   [facility] [address] [place], [place] [address] [phone]  OR   [facility] [address] [place], [place] [address] [phone]  OR   [facility] [address] [place], [place] [address] [phone]  OR   [facility] and Vascular Tower [address]  [place], [place] [address]  OR   [facility] [address] [place], [place]  If scheduled at [facility], please arrive at the [facility] and Children's Entrance (Entrance C2) of [facility] 30 minutes prior to test start time. You can use the FREE valet parking offered at entrance C (encouraged to control the heart rate for the test)  Proceed to the [facility] Radiology Department (first floor) to check-in and test prep.  All radiology patients and guests should use entrance C2 at [facility], accessed from [facility], even though the hospital's physical address listed is [address]  If scheduled at the Heart and Vascular Tower at [address], please enter the parking lot using the [address] entrance and use the FREE valet service at the  patient drop-off area. Enter the building and check-in with registration on the main floor.  If scheduled at [facility], please arrive to the Heart and Vascular Center 15 mins early for check-in and test prep.  There is spacious parking and easy access to the radiology department from the  [facility] and Vascular entrance. Please enter here and check-in with the desk attendant.   If scheduled at [facility], please arrive 30 minutes early for check-in and test prep.  Please follow these instructions carefully (unless otherwise directed):  An IV will be required for this test and Nitroglycerin  will be given.  Hold all erectile dysfunction medications at least 3 days (72 hrs) prior to test. (Ie viagra, cialis, sildenafil, tadalafil, etc)   On the Night Before the Test: Be sure to Drink plenty of water. Do not consume any caffeinated/decaffeinated beverages or chocolate 12 hours prior to your test. Do not take any antihistamines 12 hours prior to your test.   On the Day of the Test: Drink plenty of water until 1 hour prior to the test. Do not eat any food 1 hour prior to test. You may take your regular medications prior to the test.  Take metoprolol  (Lopressor ) two hours prior to test. If you take Furosemide/Hydrochlorothiazide/Spironolactone/Chlorthalidone, please HOLD on the morning of the test. Patients who wear a continuous glucose monitor MUST remove the device prior to scanning. FEMALES- please wear underwire-free bra if available, avoid dresses & tight clothing        After the Test: Drink plenty of water. After receiving IV contrast, you may experience a mild flushed feeling. This is normal. On occasion, you may experience a mild rash up to 24 hours after the test. This is not dangerous. If this occurs, you can take Benadryl 25 mg, Zyrtec, Claritin, or Allegra and increase your fluid intake. (Patients taking Tikosyn should avoid Benadryl, and may take Zyrtec, Claritin, or Allegra) If you experience trouble breathing, this can be serious. If it is severe call 911 IMMEDIATELY. If it is mild, please call our office.  We will call to schedule your test 2-4 weeks out understanding that some insurance companies will need an authorization prior to the service  being performed.   For more information and frequently asked questions, please visit our website : [email or web address]  For non-scheduling related questions, please contact the cardiac imaging nurse navigator should you have any questions/concerns: Cardiac Imaging Nurse Navigators Direct Office Dial: [phone]   For scheduling needs, including cancellations and rescheduling, please call [place], [phone].   Follow-Up: At [facility], you and your health needs are our priority.  As part of our continuing mission to provide you with exceptional heart care, our providers are all part of one team.  This team includes your primary Cardiologist (physician) and Advanced Practice Providers or APPs (Physician Assistants and Nurse Practitioners) who all work together to provide you with the care you need, when you need it.  Your next appointment:   3 month(s)  Provider:   Glendia Ferrier, PA-C          We recommend signing up for the patient portal called MyChart.  Sign up information is provided on this After Visit Summary.  MyChart is used to connect with patients for Virtual Visits (Telemedicine).  Patients are able to view lab/test results, encounter notes, upcoming appointments, etc.  Non-urgent messages can be sent to your provider as well.   To learn more about what you can do  with MyChart, go to [email or web address].   Other Instructions

## 2023-10-07 ENCOUNTER — Ambulatory Visit: Payer: Self-pay | Admitting: Physician Assistant

## 2023-10-07 LAB — BASIC METABOLIC PANEL WITH GFR
BUN/Creatinine Ratio: 12 (ref 9–23)
BUN: 11 mg/dL (ref 6–24)
CO2: 20 mmol/L (ref 20–29)
Calcium: 9.5 mg/dL (ref 8.7–10.2)
Chloride: 101 mmol/L (ref 96–106)
Creatinine, Ser: 0.93 mg/dL (ref 0.57–1.00)
Glucose: 92 mg/dL (ref 70–99)
Potassium: 4.6 mmol/L (ref 3.5–5.2)
Sodium: 135 mmol/L (ref 134–144)
eGFR: 76 mL/min/1.73 (ref >59)

## 2023-10-15 ENCOUNTER — Ambulatory Visit (HOSPITAL_COMMUNITY)

## 2023-10-15 ENCOUNTER — Telehealth (HOSPITAL_COMMUNITY): Payer: Self-pay | Admitting: *Deleted

## 2023-10-15 MED ORDER — DIPHENHYDRAMINE HCL 50 MG PO TABS
ORAL_TABLET | ORAL | 0 refills | Status: AC
Start: 2023-10-15 — End: ?

## 2023-10-15 MED ORDER — PREDNISONE 50 MG PO TABS
ORAL_TABLET | ORAL | 0 refills | Status: AC
Start: 2023-10-15 — End: ?

## 2023-10-15 NOTE — Telephone Encounter (Signed)
 Reaching out to patient to offer assistance regarding upcoming cardiac imaging study; pt verbalizes understanding of appt date/time, parking situation and where to check in, pre-test NPO status and medications ordered, and verified current allergies; name and call back number provided for further questions should they arise Sid Seats RN Navigator Cardiac Imaging [facility] [phone] office [phone] cell  Patient is unsure details about her listed iodide allergy, verbalized understanding of allergy prep.

## 2023-10-16 ENCOUNTER — Ambulatory Visit (HOSPITAL_COMMUNITY)
Admission: RE | Admit: 2023-10-16 | Discharge: 2023-10-16 | Disposition: A | Discharge status: Home / Self Care | Source: Ambulatory Visit | Attending: Physician Assistant | Admitting: Physician Assistant

## 2023-10-16 DIAGNOSIS — R072 Precordial pain: Secondary | ICD-10-CM | POA: Insufficient documentation

## 2023-10-16 MED ORDER — NITROGLYCERIN 0.4 MG SL SUBL
0.8000 mg | SUBLINGUAL_TABLET | Freq: Once | SUBLINGUAL | Status: AC
  Administered 2023-10-16: 0.8 mg via SUBLINGUAL

## 2023-10-16 MED ORDER — IOHEXOL 350 MG/ML SOLN
100.0000 mL | Freq: Once | INTRAVENOUS | Status: AC | PRN
  Administered 2023-10-16: 100 mL via INTRAVENOUS

## 2023-10-20 ENCOUNTER — Encounter: Payer: Self-pay | Admitting: Physician Assistant

## 2023-10-20 DIAGNOSIS — R079 Chest pain, unspecified: Secondary | ICD-10-CM | POA: Insufficient documentation

## 2023-11-12 ENCOUNTER — Ambulatory Visit (HOSPITAL_COMMUNITY)
Admission: RE | Admit: 2023-11-12 | Discharge: 2023-11-12 | Disposition: A | Discharge status: Home / Self Care | Source: Ambulatory Visit | Attending: Cardiology | Admitting: Cardiology

## 2023-11-12 DIAGNOSIS — R072 Precordial pain: Secondary | ICD-10-CM | POA: Diagnosis present

## 2023-11-12 LAB — ECHOCARDIOGRAM COMPLETE
Area-P 1/2: 4.06 cm2
S' Lateral: 2.5 cm

## 2023-11-16 ENCOUNTER — Telehealth: Payer: Self-pay | Admitting: Physician Assistant

## 2023-11-16 NOTE — Telephone Encounter (Signed)
   Pt c/o of Chest Pain: STAT if active CP, including tightness, pressure, jaw pain, radiating pain to shoulder/upper arm/back, CP unrelieved by Nitro. Symptoms reported of SOB, nausea, vomiting, sweating.  1. Are you having CP right now? Not currently    2. Are you experiencing any other symptoms (ex. SOB, nausea, vomiting, sweating)? Lightheaded, SOB   3. Is your CP continuous or coming and going? Coming and going   4. Have you taken Nitroglycerin ? No    5. How long have you been experiencing CP? ~1 month     6. If NO CP at time of call then end call with telling Pt to call back or call 911 if Chest pain returns prior to return call from triage team.

## 2023-11-16 NOTE — Telephone Encounter (Signed)
 Patient reports chest pain off and on for about a month. Patient reported recent visit in office and unsure on how to take Nitroglycerin . Patient  educated on how to take Nitroglycerin  and if unrelieved to call 911. Made patient aware to call office for any question. Understanding verbalized.

## 2023-11-24 ENCOUNTER — Ambulatory Visit: Admitting: Internal Medicine

## 2023-12-02 ENCOUNTER — Other Ambulatory Visit: Payer: Self-pay | Admitting: Medical Genetics

## 2023-12-07 ENCOUNTER — Encounter

## 2023-12-07 ENCOUNTER — Other Ambulatory Visit

## 2023-12-09 ENCOUNTER — Other Ambulatory Visit (HOSPITAL_COMMUNITY)

## 2023-12-14 ENCOUNTER — Telehealth: Payer: Self-pay | Admitting: Physician Assistant

## 2023-12-14 NOTE — Telephone Encounter (Signed)
 Patient identification verified by 2 forms.   Called and spoke to patient  Patient states:  -Chest pain sometime last month -Took 2 NTG with relief.   Patient denies:  -Denies chest pain currently/recently.             Interventions/Plan: -Pt would like a sooner appointment.  -Her appointment was added to the wait list.  -Pt will call back if she needs to  Reviewed ED warning signs/precautions  Patient agrees with plan, no questions at this time

## 2023-12-14 NOTE — Telephone Encounter (Signed)
  Pt c/o of Chest Pain: STAT if active CP, including tightness, pressure, jaw pain, radiating pain to shoulder/upper arm/back, CP unrelieved by Nitro. Symptoms reported of SOB, nausea, vomiting, sweating.  1. Are you having CP right now?   No  2. Are you experiencing any other symptoms (ex. SOB, nausea, vomiting, sweating)?   No  3. Is your CP continuous or coming and going?   Coming and going  4. Have you taken Nitroglycerin ?   Yes - about a month ago  5. How long have you been experiencing CP?  Patient stated last episode was a week ago  6. If NO CP at time of call then end call with telling Pt to call back or call 911 if Chest pain returns prior to return call from triage team.   Patient stated stress or anxiety brings on her chest tightness.

## 2023-12-16 ENCOUNTER — Encounter

## 2023-12-16 ENCOUNTER — Other Ambulatory Visit

## 2023-12-16 ENCOUNTER — Other Ambulatory Visit (HOSPITAL_COMMUNITY)

## 2023-12-17 ENCOUNTER — Encounter

## 2023-12-17 ENCOUNTER — Other Ambulatory Visit

## 2023-12-30 ENCOUNTER — Other Ambulatory Visit (HOSPITAL_COMMUNITY)

## 2023-12-31 ENCOUNTER — Other Ambulatory Visit (HOSPITAL_COMMUNITY)
Admission: RE | Admit: 2023-12-31 | Discharge: 2023-12-31 | Disposition: A | Discharge status: Home / Self Care | Payer: Self-pay | Source: Ambulatory Visit | Attending: Oncology | Admitting: Oncology

## 2023-12-31 ENCOUNTER — Other Ambulatory Visit (HOSPITAL_COMMUNITY)

## 2024-01-06 ENCOUNTER — Ambulatory Visit: Admitting: Physician Assistant

## 2024-01-08 ENCOUNTER — Ambulatory Visit

## 2024-01-08 ENCOUNTER — Ambulatory Visit
Admission: RE | Admit: 2024-01-08 | Discharge: 2024-01-08 | Disposition: A | Discharge status: Home / Self Care | Source: Ambulatory Visit | Attending: Family Medicine | Admitting: Family Medicine

## 2024-01-08 ENCOUNTER — Ambulatory Visit: Admitting: Physician Assistant

## 2024-01-08 ENCOUNTER — Other Ambulatory Visit: Payer: Self-pay | Admitting: Family Medicine

## 2024-01-08 DIAGNOSIS — N6489 Other specified disorders of breast: Secondary | ICD-10-CM

## 2024-01-12 LAB — GENECONNECT MOLECULAR SCREEN: Genetic Analysis Overall Interpretation: NEGATIVE

## 2024-01-20 ENCOUNTER — Ambulatory Visit: Admitting: Physician Assistant

## 2024-02-02 ENCOUNTER — Ambulatory Visit: Admitting: Physician Assistant

## 2024-02-10 ENCOUNTER — Other Ambulatory Visit (HOSPITAL_COMMUNITY): Payer: Self-pay | Admitting: Family Medicine

## 2024-02-10 DIAGNOSIS — N921 Excessive and frequent menstruation with irregular cycle: Secondary | ICD-10-CM

## 2024-02-19 ENCOUNTER — Ambulatory Visit (HOSPITAL_COMMUNITY)

## 2024-02-25 ENCOUNTER — Telehealth: Payer: Self-pay | Admitting: Physician Assistant

## 2024-02-25 NOTE — Telephone Encounter (Signed)
*  STAT* If patient is at the pharmacy, call can be transferred to refill team.   1. Which medications need to be refilled? (please list name of each medication and dose if known) nitroGLYCERIN (NITROSTAT) 0.4 MG SL tablet   2. Which pharmacy/location (including street and city if local pharmacy) is medication to be sent to? [id number] - [place], [place] - [address]   3. Do they need a 30 day or 90 day supply? 90

## 2024-02-26 MED ORDER — NITROGLYCERIN 0.4 MG SL SUBL: 0.4000 mg | SUBLINGUAL_TABLET | SUBLINGUAL | 0 refills | Status: DC | PRN

## 2024-02-26 NOTE — Telephone Encounter (Signed)
 Refill sent.  Pt scheduled 04/20/24.

## 2024-03-07 ENCOUNTER — Ambulatory Visit: Admitting: Physician Assistant

## 2024-03-11 ENCOUNTER — Ambulatory Visit (HOSPITAL_COMMUNITY)

## 2024-03-11 ENCOUNTER — Ambulatory Visit (HOSPITAL_COMMUNITY): Admission: RE | Admit: 2024-03-11 | Source: Ambulatory Visit

## 2024-03-12 ENCOUNTER — Other Ambulatory Visit: Payer: Self-pay | Admitting: Physician Assistant

## 2024-03-17 ENCOUNTER — Other Ambulatory Visit: Payer: Self-pay | Admitting: Family Medicine

## 2024-03-17 DIAGNOSIS — Z1231 Encounter for screening mammogram for malignant neoplasm of breast: Secondary | ICD-10-CM

## 2024-04-01 ENCOUNTER — Ambulatory Visit: Admitting: Student

## 2024-04-08 ENCOUNTER — Ambulatory Visit (HOSPITAL_COMMUNITY)

## 2024-04-13 ENCOUNTER — Encounter (HOSPITAL_COMMUNITY): Payer: Self-pay

## 2024-04-13 ENCOUNTER — Ambulatory Visit (HOSPITAL_COMMUNITY)

## 2024-04-20 ENCOUNTER — Ambulatory Visit: Admitting: Physician Assistant

## 2024-04-22 ENCOUNTER — Ambulatory Visit: Admitting: Physician Assistant

## 2024-04-22 ENCOUNTER — Ambulatory Visit

## 2024-05-11 ENCOUNTER — Ambulatory Visit

## 2024-05-16 ENCOUNTER — Ambulatory Visit: Admitting: Cardiology
